# Patient Record
Sex: Male | Born: 1965 | Race: White | Hispanic: No | Marital: Married | Smoking: Former smoker
Health system: Southern US, Community
[De-identification: ages and names within clinical notes are randomized; demographics above are authoritative.]

## PROBLEM LIST (undated history)

## (undated) DIAGNOSIS — U071 COVID-19: Secondary | ICD-10-CM

## (undated) DIAGNOSIS — E7800 Pure hypercholesterolemia, unspecified: Secondary | ICD-10-CM

---

## 2019-06-28 ENCOUNTER — Encounter (HOSPITAL_COMMUNITY): Payer: Self-pay | Admitting: Emergency Medicine

## 2019-06-28 ENCOUNTER — Other Ambulatory Visit: Payer: Self-pay

## 2019-06-28 ENCOUNTER — Ambulatory Visit (HOSPITAL_COMMUNITY)
Admission: EM | Admit: 2019-06-28 | Discharge: 2019-06-28 | Disposition: A | Discharge status: Home / Self Care | Payer: PRIVATE HEALTH INSURANCE | Attending: Family Medicine | Admitting: Family Medicine

## 2019-06-28 ENCOUNTER — Ambulatory Visit (INDEPENDENT_AMBULATORY_CARE_PROVIDER_SITE_OTHER): Payer: PRIVATE HEALTH INSURANCE

## 2019-06-28 DIAGNOSIS — R05 Cough: Secondary | ICD-10-CM

## 2019-06-28 DIAGNOSIS — U071 COVID-19: Secondary | ICD-10-CM | POA: Diagnosis not present

## 2019-06-28 DIAGNOSIS — R059 Cough, unspecified: Secondary | ICD-10-CM

## 2019-06-28 HISTORY — DX: Pure hypercholesterolemia, unspecified: E78.00

## 2019-06-28 HISTORY — DX: COVID-19: U07.1

## 2019-06-28 MED ORDER — BENZONATATE 100 MG PO CAPS: 100.0000 mg | ORAL_CAPSULE | Freq: Three times a day (TID) | ORAL | 0 refills | Status: AC | PRN

## 2019-06-28 NOTE — ED Triage Notes (Signed)
Reports starting with Covid sxs 06/05/19 & tested +.  C/O continued cough.

## 2019-06-28 NOTE — Discharge Instructions (Addendum)
Your xray is normal today, which is reassuring.  Unfortunately may take some time for your lungs to recover and cough to resolve.  May try using tessalon every 8 hours as needed for symptoms.  Continue to follow with your primary care provider as needed.  If develop chest pain , fevers, shortness of breath , or otherwise worsening don't hesitate to return or go to the ER.

## 2019-06-28 NOTE — ED Provider Notes (Signed)
[facility]    CSN: [id number] Arrival date & time: 06/28/19  1555      History   Chief Complaint Chief Complaint  Patient presents with  . Cough    HPI Omar Scott is a 53 y.o. male.   Omar Scott presents with complaints of persistent cough. 9/24 with symptom initial onset, mild "head cold." His roommate also became sick but then later became very ill. 4-5 days into his illness developed more of a cough. Tested positive for Covid-19 9/26. Still with cough. Has been taking allegra. No congestion, feels his cough is "deeper."  No fevers or chills. Has otherwise felt well. Feels like he has a lingering cough. No shortness of breath. No chest pain . No headache or body aches. No fatigue Retested for covid 10/14 which returned positive. Doesn't somke. No asthma history. 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     ROS per HPI, negative if not otherwise mentioned.      Past Medical History:  Diagnosis Date  . COVID-19   . High cholesterol     There are no active problems to display for this patient.   History reviewed. No pertinent surgical history.     Home Medications    Prior to Admission medications   Medication Sig Start Date End Date Taking? Authorizing Provider  atorvastatin (LIPITOR) 10 MG tablet Take 10 mg by mouth daily.   Yes [provider]  Naproxen Sodium (ALEVE Scott) Take by mouth.   Yes [provider]  benzonatate (TESSALON) 100 MG capsule Take 1-2 capsules (100-200 mg total) by mouth 3 (three) times daily as needed for cough. 06/28/19   Georgetta Haber, NP    Family History Family History  Problem Relation Age of Onset  . Asthma Mother   . Hypertension Mother     Social History Social History   Tobacco Use  . Smoking status: Former Games developer  . Smokeless tobacco: Former Engineer, water Use Topics   . Alcohol use: Yes    Alcohol/week: 25.0 standard drinks    Types: 25 Cans of beer per week  . Drug use: Not Currently    Types: Marijuana     Allergies   Patient has no known allergies.   Review of Systems Review of Systems   Physical Exam Triage Vital Signs ED Triage Vitals  Enc Vitals Group     BP      Pulse      Resp      Temp      Temp src      SpO2      Weight      Height      Head Circumference      Peak Flow      Pain Score      Pain Loc      Pain Edu?      Excl. in GC?    No data found.  Updated Vital Signs BP (!) 164/92   Pulse (!) 55   Temp 98.2 F (36.8 C) (Other (Comment))   SpO2 100%    Physical Exam Constitutional:      Appearance: He is well-developed.  Cardiovascular:     Rate and Rhythm: Normal rate and regular rhythm.  Pulmonary:     Effort: Pulmonary effort is normal.     Breath  sounds: Normal breath sounds.     Comments: Occasional dry cough noted  Skin:    General: Skin is warm and dry.  Neurological:     Mental Status: He is alert and oriented to person, place, and time.      UC Treatments / Results  Labs (all labs ordered are listed, but only abnormal results are displayed) Labs Reviewed - No data to display  EKG   Radiology Dg Chest 2 View  Result Date: 06/28/2019 CLINICAL DATA:  53 year old male with recent COVID-19 positive presenting with cough. EXAM: CHEST - 2 VIEW COMPARISON:  None. FINDINGS: The heart size and mediastinal contours are within normal limits. Both lungs are clear. The visualized skeletal structures are unremarkable. IMPRESSION: No active cardiopulmonary disease. Electronically Signed   By: Anner Crete M.D.   On: 06/28/2019 17:44    Procedures Procedures (including critical care time)  Medications Ordered in UC Medications - No data to display  Initial Impression / Assessment and Plan / UC Course  I have reviewed the triage vital signs and the nursing notes.  Pertinent labs &  imaging results that were available during my care of the patient were reviewed by me and considered in my medical decision making (see chart for details).     Non toxic. Benign physical exam.  Occasional dry cough noted but no increased work of breathing, no shortness of breath. No hypoxia, no fevers. Chest xray is unremarkable. Still with positive covid-19 testing. Discussed course of covid-19 for some patients and that unfortunately for some cough may linger longer. No evidence for need for antibiotics at this time. Supportive cares recommended. Continue to follow with PCP as needed. Return precautions provided. Patient verbalized understanding and agreeable to plan.   Final Clinical Impressions(s) / UC Diagnoses   Final diagnoses:  Cough  COVID-19 virus infection     Discharge Instructions     Your xray is normal today, which is reassuring.  Unfortunately may take some time for your lungs to recover and cough to resolve.  May try using tessalon every 8 hours as needed for symptoms.  Continue to follow with your primary care provider as needed.  If develop chest pain , fevers, shortness of breath , or otherwise worsening don't hesitate to return or go to the ER.     ED Prescriptions    Medication Sig Dispense Auth. Provider   benzonatate (TESSALON) 100 MG capsule Take 1-2 capsules (100-200 mg total) by mouth 3 (three) times daily as needed for cough. 21 capsule Zigmund Gottron, NP     PDMP not reviewed this encounter.   Zigmund Gottron, NP 06/29/19 [phone]

## 2021-01-28 IMAGING — DX DG CHEST 2V
2 series · 3 of 3 positions shown · non-contrast
Comparison: None.

CLINICAL DATA: 53-year-old male with recent Z0T4A-CG positive
presenting with cough.

EXAM:
CHEST - 2 VIEW

[Series 2: chest pa · 0.14mm/px · 2 of 2 slices shown]
[im 1/2]
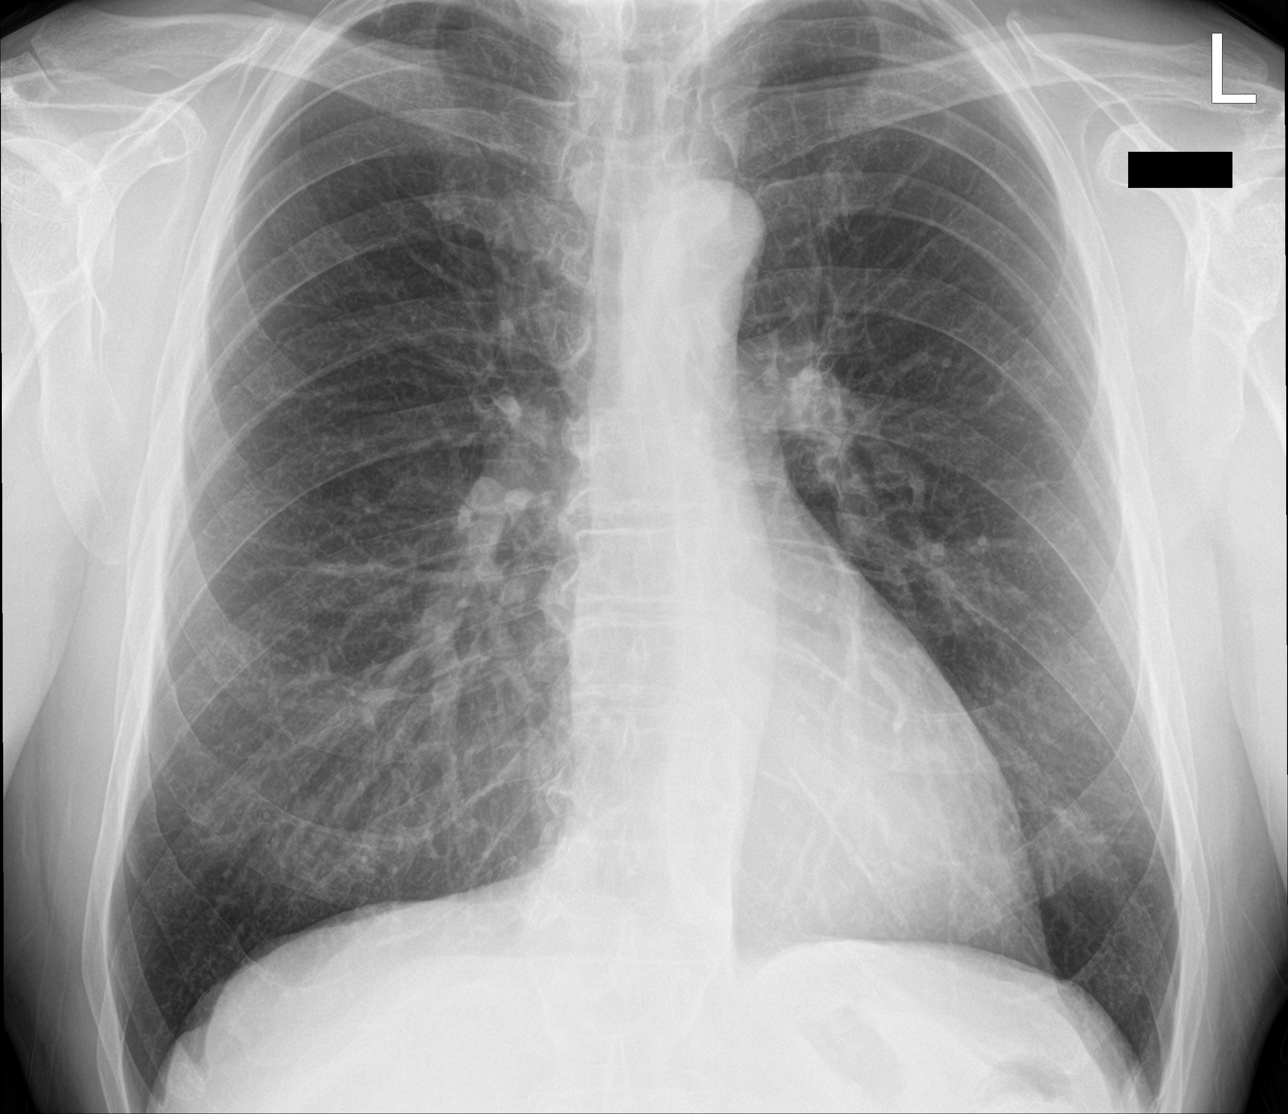
[im 2/2]
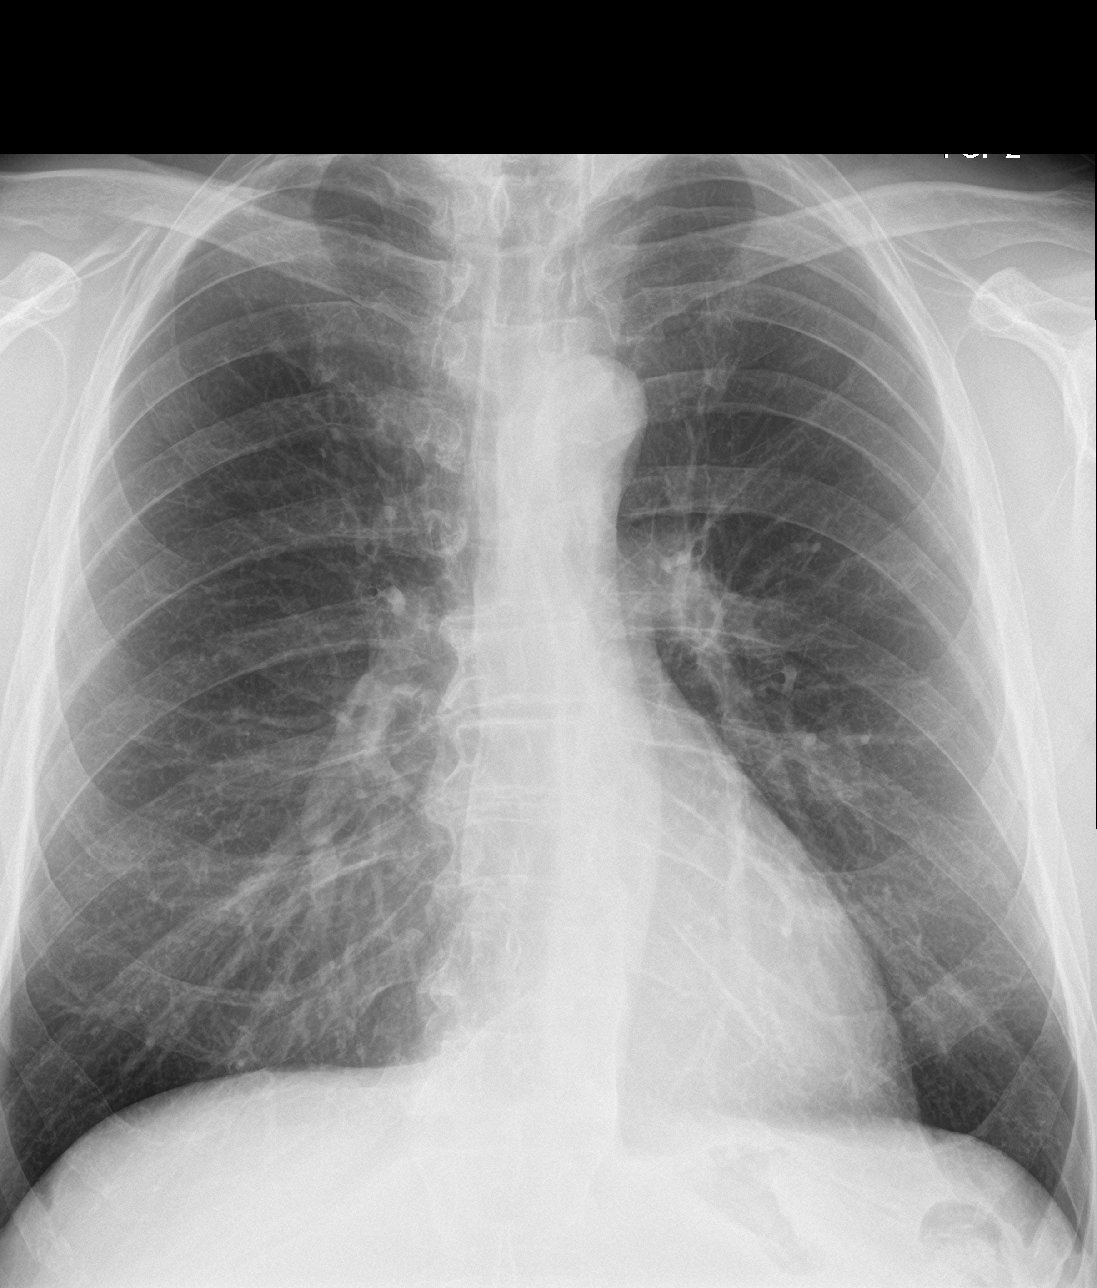

[chest lat]
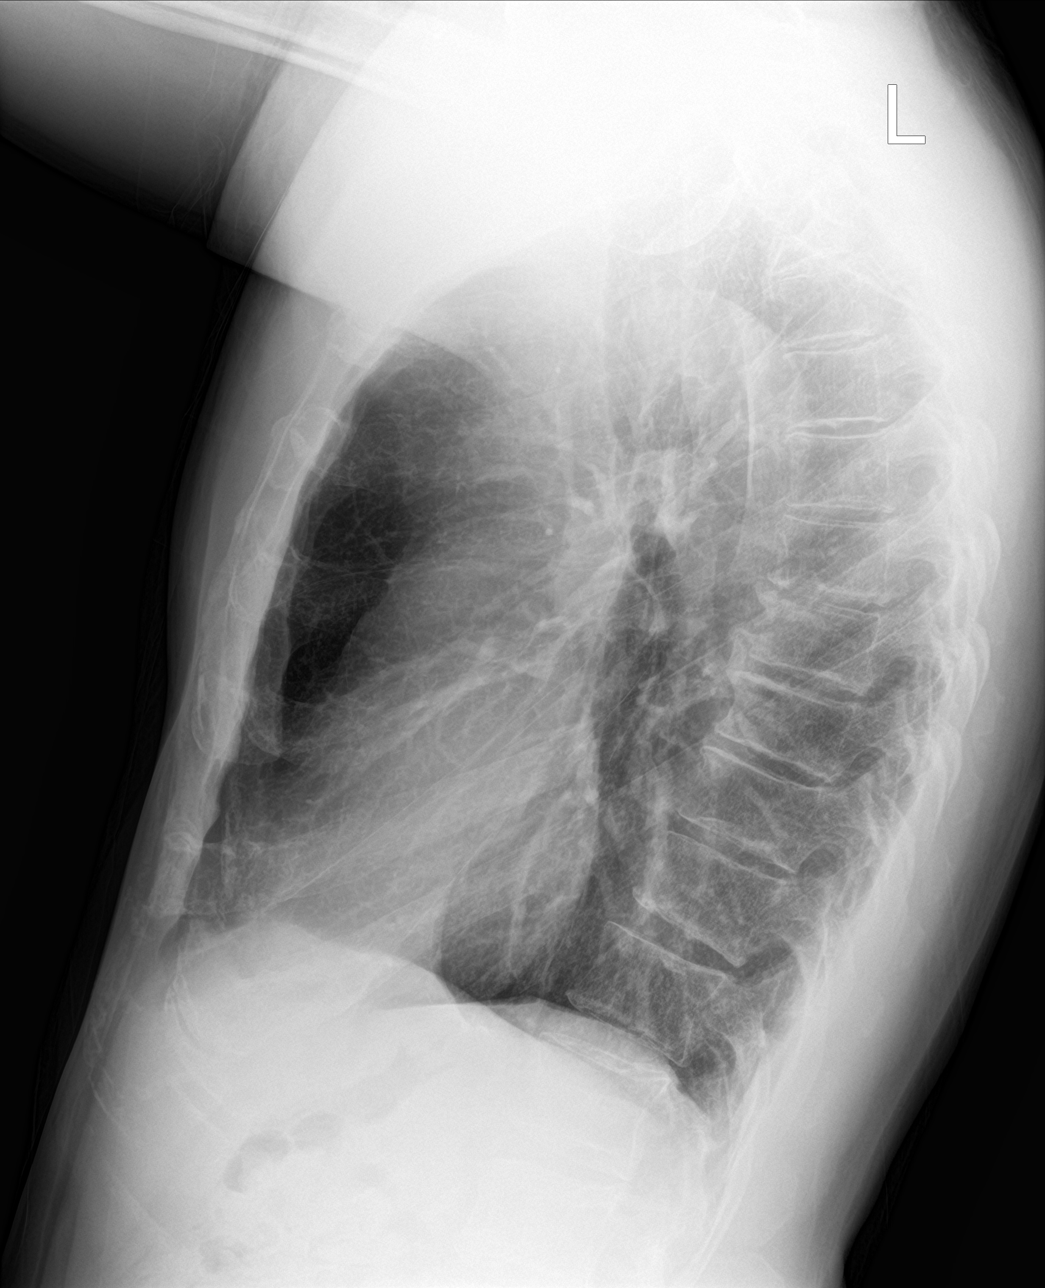

[3 of 3 positions shown; findings below may reference images not displayed]

FINDINGS: The heart size and mediastinal contours are within normal limits.
Both lungs are clear. The visualized skeletal structures are
unremarkable.
IMPRESSION: No active cardiopulmonary disease.
